# Patient Record
Sex: Female | Born: 1985 | Hispanic: No | Marital: Married | Smoking: Never smoker
Health system: Southern US, Community
[De-identification: ages and names within clinical notes are randomized; demographics above are authoritative.]

## PROBLEM LIST (undated history)

## (undated) DIAGNOSIS — R87629 Unspecified abnormal cytological findings in specimens from vagina: Secondary | ICD-10-CM

## (undated) DIAGNOSIS — I10 Essential (primary) hypertension: Secondary | ICD-10-CM

## (undated) DIAGNOSIS — E039 Hypothyroidism, unspecified: Secondary | ICD-10-CM

## (undated) HISTORY — PX: TONSILLECTOMY: SUR1361

## (undated) HISTORY — PX: LUMBAR DISC SURGERY: SHX700

## (undated) HISTORY — PX: WISDOM TOOTH EXTRACTION: SHX21

## (undated) HISTORY — DX: Unspecified abnormal cytological findings in specimens from vagina: R87.629

---

## 2018-05-09 LAB — OB RESULTS CONSOLE RUBELLA ANTIBODY, IGM: Rubella: IMMUNE

## 2018-05-09 LAB — OB RESULTS CONSOLE ANTIBODY SCREEN: Antibody Screen: NEGATIVE

## 2018-05-09 LAB — OB RESULTS CONSOLE RPR: RPR: NONREACTIVE

## 2018-05-09 LAB — OB RESULTS CONSOLE HEPATITIS B SURFACE ANTIGEN: Hepatitis B Surface Ag: NEGATIVE

## 2018-05-09 LAB — OB RESULTS CONSOLE ABO/RH: RH Type: POSITIVE

## 2018-05-09 LAB — OB RESULTS CONSOLE HIV ANTIBODY (ROUTINE TESTING)
HIV: NONREACTIVE
HIV: NONREACTIVE

## 2018-06-05 ENCOUNTER — Other Ambulatory Visit: Payer: Self-pay | Admitting: Obstetrics and Gynecology

## 2018-06-05 ENCOUNTER — Other Ambulatory Visit (HOSPITAL_COMMUNITY)
Admission: RE | Admit: 2018-06-05 | Discharge: 2018-06-05 | Disposition: A | Discharge status: Home / Self Care | Payer: 59 | Source: Ambulatory Visit | Attending: Obstetrics and Gynecology | Admitting: Obstetrics and Gynecology

## 2018-06-05 DIAGNOSIS — Z01411 Encounter for gynecological examination (general) (routine) with abnormal findings: Secondary | ICD-10-CM | POA: Diagnosis present

## 2018-06-05 LAB — OB RESULTS CONSOLE GC/CHLAMYDIA
Chlamydia: NEGATIVE
Gonorrhea: NEGATIVE

## 2018-06-07 LAB — CYTOLOGY - PAP
DIAGNOSIS: NEGATIVE
HPV (WINDOPATH): NOT DETECTED

## 2018-07-02 ENCOUNTER — Encounter (HOSPITAL_COMMUNITY): Payer: Self-pay

## 2018-08-01 NOTE — L&D Delivery Note (Signed)
Delivery Note At 9:19 AM a viable female was delivered via Vaginal, Spontaneous (Presentation:occiput anterior  ;  ). Nuchal cord x 1 reduced  APGAR: 9, 9; weight 4 lb 13.6 oz (2200 g).   Placenta status: ,intact  . 3 vessel  Cord:  with the following complications:None .  Cord pH: NA  Anesthesia:  None Episiotomy: None Lacerations: 2nd degree;Sulcus Suture Repair: 3.0 vicryl Est. Blood Loss (mL): 400  Mom to postpartum.  Baby to Couplet care / Skin to Skin.  Gerald Leitz 11/14/2018, 11:37 AM

## 2018-08-16 LAB — OB RESULTS CONSOLE RPR: RPR: NONREACTIVE

## 2018-08-31 ENCOUNTER — Other Ambulatory Visit (HOSPITAL_COMMUNITY): Payer: Self-pay | Admitting: Obstetrics and Gynecology

## 2018-08-31 DIAGNOSIS — Z363 Encounter for antenatal screening for malformations: Secondary | ICD-10-CM

## 2018-08-31 DIAGNOSIS — Z3A31 31 weeks gestation of pregnancy: Secondary | ICD-10-CM

## 2018-08-31 DIAGNOSIS — O365930 Maternal care for other known or suspected poor fetal growth, third trimester, not applicable or unspecified: Secondary | ICD-10-CM

## 2018-09-04 ENCOUNTER — Encounter (HOSPITAL_COMMUNITY): Payer: Self-pay | Admitting: *Deleted

## 2018-09-06 ENCOUNTER — Encounter (HOSPITAL_COMMUNITY): Payer: 59

## 2018-09-06 ENCOUNTER — Ambulatory Visit (HOSPITAL_BASED_OUTPATIENT_CLINIC_OR_DEPARTMENT_OTHER)
Admission: RE | Admit: 2018-09-06 | Discharge: 2018-09-06 | Disposition: A | Discharge status: Home / Self Care | Payer: 59 | Source: Ambulatory Visit | Attending: Obstetrics and Gynecology | Admitting: Obstetrics and Gynecology

## 2018-09-06 ENCOUNTER — Other Ambulatory Visit (HOSPITAL_COMMUNITY): Payer: Self-pay | Admitting: Obstetrics and Gynecology

## 2018-09-06 ENCOUNTER — Other Ambulatory Visit (HOSPITAL_COMMUNITY): Payer: Self-pay | Admitting: *Deleted

## 2018-09-06 ENCOUNTER — Ambulatory Visit (HOSPITAL_COMMUNITY)
Admission: RE | Admit: 2018-09-06 | Discharge: 2018-09-06 | Disposition: A | Discharge status: Home / Self Care | Payer: 59 | Source: Ambulatory Visit | Attending: Obstetrics and Gynecology | Admitting: Obstetrics and Gynecology

## 2018-09-06 ENCOUNTER — Encounter (HOSPITAL_COMMUNITY): Payer: Self-pay

## 2018-09-06 DIAGNOSIS — O365930 Maternal care for other known or suspected poor fetal growth, third trimester, not applicable or unspecified: Secondary | ICD-10-CM | POA: Insufficient documentation

## 2018-09-06 DIAGNOSIS — O10013 Pre-existing essential hypertension complicating pregnancy, third trimester: Secondary | ICD-10-CM | POA: Diagnosis not present

## 2018-09-06 DIAGNOSIS — Z362 Encounter for other antenatal screening follow-up: Secondary | ICD-10-CM

## 2018-09-06 DIAGNOSIS — Z363 Encounter for antenatal screening for malformations: Secondary | ICD-10-CM | POA: Diagnosis not present

## 2018-09-06 DIAGNOSIS — O99283 Endocrine, nutritional and metabolic diseases complicating pregnancy, third trimester: Secondary | ICD-10-CM | POA: Diagnosis not present

## 2018-09-06 DIAGNOSIS — Z3A31 31 weeks gestation of pregnancy: Secondary | ICD-10-CM | POA: Diagnosis present

## 2018-09-06 DIAGNOSIS — Z3A36 36 weeks gestation of pregnancy: Secondary | ICD-10-CM

## 2018-09-06 DIAGNOSIS — O10913 Unspecified pre-existing hypertension complicating pregnancy, third trimester: Secondary | ICD-10-CM | POA: Diagnosis not present

## 2018-09-06 DIAGNOSIS — E039 Hypothyroidism, unspecified: Secondary | ICD-10-CM | POA: Diagnosis not present

## 2018-09-06 DIAGNOSIS — O365990 Maternal care for other known or suspected poor fetal growth, unspecified trimester, not applicable or unspecified: Secondary | ICD-10-CM | POA: Diagnosis not present

## 2018-09-06 DIAGNOSIS — Z3A28 28 weeks gestation of pregnancy: Secondary | ICD-10-CM

## 2018-09-06 HISTORY — DX: Essential (primary) hypertension: I10

## 2018-09-06 HISTORY — DX: Hypothyroidism, unspecified: E03.9

## 2018-09-06 NOTE — Consult Note (Signed)
Maternal-Fetal Medicine Name: Brooke Reeves MRN: [id number] Requesting Provider: Geryl Rankins, MD  I had the pleasure of seeing Brooke Reeves today at  [facility]. She was accompanied by her husband. Patient is a G1 P0 at 28w 4d gestation with suspected fetal growth restriction.  On your office ultrasound performed in December 2019, the estimated fetal weight was at the 39th percentile. Subsequent ultrasound performed on 08/30/2018 showed the estimated fetal weight at the 19th percentile.   Her prenatal course has been uneventful so far. On serum screening (quad), the risks of fetal aneuploidies and open-neural tube defects are not increased.  Past medical history is significant for chronic hypertension. Patient was taking amlodipine before pregnancy and she discontinued at the start of pregnancy on the advice of her PCP. Her blood pressures have been during prenatal visits. She takes low-dose aspirin prophylaxis.  She does not have diabetes. She has a diagnosis of hypothyroidism and takes levothyroxine 25 micrograms daily.  PSH: Lumbosacral (L4-S1) discectomy (2017 in [place]), tonsillectomy (childhood).  Medications: Prenatal vitamins, levothyroxine, low-dose aspirin. Allergies: NKDA. Social: Denies tobacco or drug or alcohol use. She has been married 4+ years and her husband is in good health. Patient recently moved to the [place] (2017). Family: No history of venous thromboembolism in the family.  On ultrasound, amniotic fluid is normal and good fetal activity is seen. The estimated fetal weight is at the 17th percentile. Head circumference measurement is at between -1 SD and mean. Abdominal circumference measurement is at the 4th percentile. Incidentally observed antenatal testing is reassuring (BPP 8/8). Umbilical artery Doppler showed normal diastolic flow.  I counseled the patient on the following: Suspected fetal growth restriction: I explained the findings and  reassured the couple. I discussed the definition of IUGR that is based on the estimated fetal weigh being at less than the 10th percentile. It is difficult to differentiate between a constitutionally-small fetus and the one with growth restriction. In addition, there are ethnic variations.   Patient has one risk factor of chronic hypertension that is well-controlled without antihypertensives. This is her first pregnancy and both primigravida and chronic hypertension increase the likelihood of preeclampsia. Fetal growth restriction may precede development of preeclampsia. I discussed the benefit of low-dose aspirin that delays or prevents preeclampsia. She does not have any other risk factors.  I recommend a follow-up fetal growth assessment in 3 weeks. If EFW falls below the 10th percentile, we would recommend weekly antenatal testing till delivery. I explained antenatal testing (BPP, NST and Doppler) and their significance and limitations. Timing of delivery: ACOG recommends delivery between 38w 0d till 39w 6d gestation in isolated fetal growth restriction. If fetal growth restriction is diagnosed on subsequent visits, we would recommend delivery at 39 weeks provided antenatal testing is reassuring.  Her history of discectomy is not a contraindication to vaginal delivery.   Recommendations: -An appointment was made for her to return in 3 weeks for fetal growth assessment. -Continue low-dose aspirin. -BP checks at least weekly. -Continue levothyroxine. -TSH levels if not checked in the last month.  Thank you for your consult. Please do not hesitate to contact me if you have any questions or concerns.  Consultation including face-to-face counseling: 40 min.

## 2018-09-12 LAB — OB RESULTS CONSOLE HIV ANTIBODY (ROUTINE TESTING): HIV: NONREACTIVE

## 2018-09-27 ENCOUNTER — Ambulatory Visit (HOSPITAL_COMMUNITY): Payer: 59 | Admitting: *Deleted

## 2018-09-27 ENCOUNTER — Encounter (HOSPITAL_COMMUNITY): Payer: Self-pay | Admitting: *Deleted

## 2018-09-27 ENCOUNTER — Ambulatory Visit (HOSPITAL_COMMUNITY)
Admission: RE | Admit: 2018-09-27 | Discharge: 2018-09-27 | Disposition: A | Discharge status: Home / Self Care | Payer: 59 | Source: Ambulatory Visit | Attending: Obstetrics and Gynecology | Admitting: Obstetrics and Gynecology

## 2018-09-27 VITALS — BP 126/86 | HR 109 | Wt 150.1 lb

## 2018-09-27 DIAGNOSIS — O99280 Endocrine, nutritional and metabolic diseases complicating pregnancy, unspecified trimester: Principal | ICD-10-CM

## 2018-09-27 DIAGNOSIS — E079 Disorder of thyroid, unspecified: Secondary | ICD-10-CM

## 2018-09-27 DIAGNOSIS — E039 Hypothyroidism, unspecified: Secondary | ICD-10-CM

## 2018-09-27 DIAGNOSIS — Z362 Encounter for other antenatal screening follow-up: Secondary | ICD-10-CM | POA: Insufficient documentation

## 2018-09-27 DIAGNOSIS — O99283 Endocrine, nutritional and metabolic diseases complicating pregnancy, third trimester: Secondary | ICD-10-CM

## 2018-09-27 DIAGNOSIS — O10013 Pre-existing essential hypertension complicating pregnancy, third trimester: Secondary | ICD-10-CM | POA: Diagnosis not present

## 2018-09-27 DIAGNOSIS — Z3A31 31 weeks gestation of pregnancy: Secondary | ICD-10-CM

## 2018-09-27 DIAGNOSIS — O368930 Maternal care for other specified fetal problems, third trimester, not applicable or unspecified: Secondary | ICD-10-CM | POA: Diagnosis not present

## 2018-09-28 ENCOUNTER — Other Ambulatory Visit (HOSPITAL_COMMUNITY): Payer: Self-pay | Admitting: *Deleted

## 2018-09-28 DIAGNOSIS — Z362 Encounter for other antenatal screening follow-up: Secondary | ICD-10-CM

## 2018-10-18 ENCOUNTER — Ambulatory Visit (HOSPITAL_COMMUNITY): Payer: 59

## 2018-10-18 ENCOUNTER — Encounter (HOSPITAL_COMMUNITY): Payer: Self-pay

## 2018-10-31 LAB — OB RESULTS CONSOLE GBS: GBS: POSITIVE

## 2018-11-13 ENCOUNTER — Other Ambulatory Visit: Payer: Self-pay

## 2018-11-13 ENCOUNTER — Encounter (HOSPITAL_COMMUNITY): Payer: Self-pay

## 2018-11-13 ENCOUNTER — Other Ambulatory Visit: Payer: Self-pay | Admitting: Obstetrics and Gynecology

## 2018-11-13 ENCOUNTER — Telehealth (HOSPITAL_COMMUNITY): Payer: Self-pay | Admitting: *Deleted

## 2018-11-13 ENCOUNTER — Encounter (HOSPITAL_COMMUNITY): Payer: Self-pay | Admitting: *Deleted

## 2018-11-13 ENCOUNTER — Inpatient Hospital Stay (HOSPITAL_COMMUNITY)
Admission: AD | Admit: 2018-11-13 | Discharge: 2018-11-17 | DRG: 807 | Disposition: A | Discharge status: Home / Self Care | Payer: 59 | Attending: Obstetrics and Gynecology | Admitting: Obstetrics and Gynecology

## 2018-11-13 DIAGNOSIS — O1002 Pre-existing essential hypertension complicating childbirth: Principal | ICD-10-CM | POA: Diagnosis present

## 2018-11-13 DIAGNOSIS — E039 Hypothyroidism, unspecified: Secondary | ICD-10-CM | POA: Diagnosis present

## 2018-11-13 DIAGNOSIS — O99284 Endocrine, nutritional and metabolic diseases complicating childbirth: Secondary | ICD-10-CM | POA: Diagnosis present

## 2018-11-13 DIAGNOSIS — O365930 Maternal care for other known or suspected poor fetal growth, third trimester, not applicable or unspecified: Secondary | ICD-10-CM | POA: Diagnosis present

## 2018-11-13 DIAGNOSIS — O99824 Streptococcus B carrier state complicating childbirth: Secondary | ICD-10-CM | POA: Diagnosis present

## 2018-11-13 DIAGNOSIS — Z3A38 38 weeks gestation of pregnancy: Secondary | ICD-10-CM | POA: Diagnosis not present

## 2018-11-13 DIAGNOSIS — O10919 Unspecified pre-existing hypertension complicating pregnancy, unspecified trimester: Secondary | ICD-10-CM | POA: Diagnosis present

## 2018-11-13 LAB — COMPREHENSIVE METABOLIC PANEL
ALT: 14 U/L (ref 0–44)
AST: 32 U/L (ref 15–41)
Albumin: 2.6 g/dL — ABNORMAL LOW (ref 3.5–5.0)
Alkaline Phosphatase: 198 U/L — ABNORMAL HIGH (ref 38–126)
Anion gap: 13 (ref 5–15)
BUN: 8 mg/dL (ref 6–20)
CO2: 18 mmol/L — ABNORMAL LOW (ref 22–32)
Calcium: 9.4 mg/dL (ref 8.9–10.3)
Chloride: 105 mmol/L (ref 98–111)
Creatinine, Ser: 0.62 mg/dL (ref 0.44–1.00)
GFR calc Af Amer: >60 mL/min (ref >60)
GFR calc non Af Amer: >60 mL/min (ref >60)
Glucose, Bld: 125 mg/dL — ABNORMAL HIGH (ref 70–99)
Potassium: 4.2 mmol/L (ref 3.5–5.1)
Sodium: 136 mmol/L (ref 135–145)
Total Bilirubin: 0.4 mg/dL (ref 0.3–1.2)
Total Protein: 5.6 g/dL — ABNORMAL LOW (ref 6.5–8.1)

## 2018-11-13 LAB — CBC
HCT: 32.2 % — ABNORMAL LOW (ref 36.0–46.0)
Hemoglobin: 10.8 g/dL — ABNORMAL LOW (ref 12.0–15.0)
MCH: 32.4 pg (ref 26.0–34.0)
MCHC: 33.5 g/dL (ref 30.0–36.0)
MCV: 96.7 fL (ref 80.0–100.0)
Platelets: 100 10*3/uL — ABNORMAL LOW (ref 150–400)
RBC: 3.33 MIL/uL — ABNORMAL LOW (ref 3.87–5.11)
RDW: 15.8 % — ABNORMAL HIGH (ref 11.5–15.5)
WBC: 13.4 10*3/uL — ABNORMAL HIGH (ref 4.0–10.5)
nRBC: 0.1 % (ref 0.0–0.2)

## 2018-11-13 LAB — TYPE AND SCREEN
ABO/RH(D): A POS
Antibody Screen: NEGATIVE

## 2018-11-13 LAB — PROTEIN / CREATININE RATIO, URINE
Creatinine, Urine: 38.74 mg/dL
Total Protein, Urine: <6 mg/dL

## 2018-11-13 MED ORDER — ACETAMINOPHEN 325 MG PO TABS
650.0000 mg | ORAL_TABLET | ORAL | Status: DC | PRN
  Administered 2018-11-14: 01:00:00 650 mg via ORAL
  Filled 2018-11-13: qty 2

## 2018-11-13 MED ORDER — ONDANSETRON HCL 4 MG/2ML IJ SOLN: 4.0000 mg | Freq: Four times a day (QID) | INTRAMUSCULAR | Status: DC | PRN

## 2018-11-13 MED ORDER — HYDRALAZINE HCL 20 MG/ML IJ SOLN
10.0000 mg | INTRAMUSCULAR | Status: DC | PRN
  Administered 2018-11-13: 22:00:00 10 mg via INTRAVENOUS

## 2018-11-13 MED ORDER — TERBUTALINE SULFATE 1 MG/ML IJ SOLN: 0.2500 mg | Freq: Once | INTRAMUSCULAR | Status: DC | PRN

## 2018-11-13 MED ORDER — SOD CITRATE-CITRIC ACID 500-334 MG/5ML PO SOLN: 30.0000 mL | ORAL | Status: DC | PRN

## 2018-11-13 MED ORDER — OXYTOCIN 40 UNITS IN NORMAL SALINE INFUSION - SIMPLE MED: 2.5000 [IU]/h | INTRAVENOUS | Status: DC

## 2018-11-13 MED ORDER — OXYTOCIN 40 UNITS IN NORMAL SALINE INFUSION - SIMPLE MED
1.0000 m[IU]/min | INTRAVENOUS | Status: DC
  Administered 2018-11-13: 2 m[IU]/min via INTRAVENOUS
  Filled 2018-11-13: qty 1000

## 2018-11-13 MED ORDER — PENICILLIN G 3 MILLION UNITS IVPB - SIMPLE MED
3.0000 10*6.[IU] | INTRAVENOUS | Status: DC
  Administered 2018-11-14 (×2): 3 10*6.[IU] via INTRAVENOUS
  Filled 2018-11-13 (×2): qty 100

## 2018-11-13 MED ORDER — LACTATED RINGERS IV SOLN
INTRAVENOUS | Status: DC
  Administered 2018-11-13 – 2018-11-14 (×3): via INTRAVENOUS

## 2018-11-13 MED ORDER — SODIUM CHLORIDE 0.9 % IV SOLN
5.0000 10*6.[IU] | Freq: Once | INTRAVENOUS | Status: AC
  Administered 2018-11-13: 5 10*6.[IU] via INTRAVENOUS
  Filled 2018-11-13: qty 5

## 2018-11-13 MED ORDER — OXYTOCIN BOLUS FROM INFUSION: 500.0000 mL | Freq: Once | INTRAVENOUS | Status: DC

## 2018-11-13 MED ORDER — OXYCODONE-ACETAMINOPHEN 5-325 MG PO TABS: 1.0000 | ORAL_TABLET | ORAL | Status: DC | PRN

## 2018-11-13 MED ORDER — LACTATED RINGERS IV SOLN: 500.0000 mL | INTRAVENOUS | Status: DC | PRN

## 2018-11-13 MED ORDER — BUTORPHANOL TARTRATE 1 MG/ML IJ SOLN
1.0000 mg | INTRAMUSCULAR | Status: DC | PRN
  Administered 2018-11-13 – 2018-11-14 (×3): 1 mg via INTRAVENOUS
  Filled 2018-11-13 (×3): qty 1

## 2018-11-13 MED ORDER — MISOPROSTOL 25 MCG QUARTER TABLET: 25.0000 ug | ORAL_TABLET | ORAL | Status: DC | PRN

## 2018-11-13 MED ORDER — MISOPROSTOL 50MCG HALF TABLET
ORAL_TABLET | ORAL | Status: AC
  Filled 2018-11-13: qty 1

## 2018-11-13 MED ORDER — LABETALOL HCL 5 MG/ML IV SOLN: 40.0000 mg | INTRAVENOUS | Status: DC | PRN

## 2018-11-13 MED ORDER — MISOPROSTOL 25 MCG QUARTER TABLET
ORAL_TABLET | ORAL | Status: AC
  Filled 2018-11-13: qty 1

## 2018-11-13 MED ORDER — MISOPROSTOL 50MCG HALF TABLET
50.0000 ug | ORAL_TABLET | ORAL | Status: DC | PRN
  Administered 2018-11-13: 16:00:00 50 ug via ORAL

## 2018-11-13 MED ORDER — OXYCODONE-ACETAMINOPHEN 5-325 MG PO TABS: 2.0000 | ORAL_TABLET | ORAL | Status: DC | PRN

## 2018-11-13 MED ORDER — LABETALOL HCL 5 MG/ML IV SOLN
20.0000 mg | INTRAVENOUS | Status: DC | PRN
  Administered 2018-11-13: 20 mg via INTRAVENOUS
  Filled 2018-11-13: qty 4

## 2018-11-13 MED ORDER — LIDOCAINE HCL (PF) 1 % IJ SOLN
30.0000 mL | INTRAMUSCULAR | Status: AC | PRN
  Administered 2018-11-14: 30 mL via SUBCUTANEOUS
  Filled 2018-11-13: qty 30

## 2018-11-13 MED ORDER — HYDROXYZINE HCL 50 MG PO TABS: 50.0000 mg | ORAL_TABLET | Freq: Four times a day (QID) | ORAL | Status: DC | PRN

## 2018-11-13 MED ORDER — ZOLPIDEM TARTRATE 5 MG PO TABS: 5.0000 mg | ORAL_TABLET | Freq: Every evening | ORAL | Status: DC | PRN

## 2018-11-13 MED ORDER — HYDRALAZINE HCL 20 MG/ML IJ SOLN
5.0000 mg | INTRAMUSCULAR | Status: DC | PRN
  Administered 2018-11-13: 5 mg via INTRAVENOUS
  Filled 2018-11-13: qty 1

## 2018-11-13 NOTE — H&P (Signed)
Brooke Reeves is a 33 y.o. female G1 @ 38 2/7 weeks with CHTN, symmetric IUGR presenting IOL. CHTN prior to pregnancy, which was managed with Amlodipine.  Pt has not required medication this pregnancy.  BP on average 110s-120s/70s.  Pt's BP today in the office is 140/90, 144/82, 138/82.  Pt denies headache, visual changes, upper abdominal pain.  Pt previously had  SGA baby previously with normal AC with MFM x 2. Subsequently,  IUGR diagnosed at 36 weeks.  FOB refused dopplers b/c he felt that in his ethnicity/genetic baby's are smaller and baby was active and increased cost.  Refused IOL at 37 weeks..  D/w Dr. Judeth Reeves.  Ok to do weekly NSTs/BPP until 38-39 weeks.  Pt reports baby has been reassuring.  Testing today shows BPP 10/10.   OB History    Gravida  1   Para      Term      Preterm      AB      Living  0     SAB      TAB      Ectopic      Multiple      Live Births             Past Medical History:  Diagnosis Date  . Hypertension   . Hypothyroidism   . Vaginal Pap smear, abnormal    Past Surgical History:  Procedure Laterality Date  . LUMBAR DISC SURGERY    . TONSILLECTOMY    . WISDOM TOOTH EXTRACTION     Family History: family history includes Heart disease in her maternal grandfather; Hypertension in her brother, maternal grandfather, mother, and paternal grandfather. Social History:  reports that she has never smoked. She has never used smokeless tobacco. She reports that she does not drink alcohol or use drugs.     Maternal Diabetes: No Genetic Screening: Normal Maternal Ultrasounds/Referrals: Abnormal:  Findings:   IUGR Fetal Ultrasounds or other Referrals:  Referred to Materal Fetal Medicine  Maternal Substance Abuse:  No Significant Maternal Medications:  Meds include: Syntroid Significant Maternal Lab Results:  Lab values include: Group B Strep positive Other Comments:  Maternal h/o CHTN  ROS Maternal Medical History:  Contractions: Frequency:  rare.   Perceived severity is mild.    Fetal activity: Perceived fetal activity is normal.    Prenatal complications: PIH.   Prenatal Complications - Diabetes: none.      Last menstrual period 02/18/2018. Maternal Exam:  Uterine Assessment: Contraction frequency is rare.   Abdomen: Patient reports no abdominal tenderness. Estimated fetal weight is 5 lb 11 oz +/- 8 oz EFW 11%- 11/13/18, [place] 5.5 lbs.   Fetal presentation: vertex  Introitus: Normal vulva. Pelvis: adequate for delivery.   Cervix: Cervix evaluated by digital exam.     Fetal Exam Fetal Monitor Review: Mode: fetoscope.   Baseline rate: 130s.  Variability: moderate (6-25 bpm).   Pattern: accelerations present.   BPP 10/10, AFI = 17.1 cm  Fetal State Assessment: Category I - tracings are normal.     Physical Exam  Constitutional: She is oriented to person, place, and time. She appears well-developed and well-nourished.  HENT:  Head: Normocephalic and atraumatic.  Eyes: EOM are normal.  Neck: Normal range of motion.  Respiratory: Effort normal. No respiratory distress.  GI: There is no abdominal tenderness.  Genitourinary:    Vulva normal.   Musculoskeletal: Normal range of motion.        General: Edema present. No tenderness.  Neurological: She is alert and oriented to person, place, and time. She has normal reflexes.  Skin: Skin is warm and dry.  Psychiatric: She has a normal mood and affect.    Prenatal labs: ABO, Rh: A/Positive/-- (10/09 0000) Antibody: Negative (10/09 0000) Rubella: Immune (10/09 0000) RPR: Nonreactive (10/09 0000)  HBsAg: Negative (10/09 0000)  HIV: Non-reactive (10/09 0000)  GBS:   Positive  Assessment/Plan: IUP @ 38 2/7 weeks CHTN   Normal BP until today.  Recommend moving towards delivery  Cytotec for cervical ripening.  Discussed with pt and husband at length.  Baseline preeclampsia labs on admission. IUGR, symmetric.    BPP 10/10 H/o L4-L5  discectomy.  Previously cleared with anesthesiologist. Hypothyroidism- Well controlled on 25 mcg GBS positive.  PCN with ROM or labor.  Dr. Charlotta Newtonzan covering and has been given a full report.  Pt and husband are aware. Brooke Reeves 11/13/2018, 12:39 PM

## 2018-11-13 NOTE — Telephone Encounter (Signed)
Preadmission screen  

## 2018-11-13 NOTE — Progress Notes (Signed)
OB PN:  S: Pt was feeling painful contractions, now resting comfortably s/p  O: BP (!) 157/104 (BP Location: Right Arm)   Pulse 92   Temp 98.4 F (36.9 C) (Oral)   Resp 16   Ht 5\' 2"  (1.575 m)   Wt 73.1 kg   LMP 02/18/2018   BMI 29.47 kg/m    BP range: 147-157/95-115  FHT: 140bpm, moderate variablity, + accels, no decels Toco: irregular SVE: 1/30-3, Cook balloon placed with 50cc  Results for orders placed or performed during the hospital encounter of 11/13/18 (from the past 24 hour(s))  Type and screen     Status: None   Collection Time: 11/13/18  3:17 PM  Result Value Ref Range   ABO/RH(D) A POS    Antibody Screen NEG    Sample Expiration      11/16/2018 Performed at [facility], [address] [place], [place] [address]   ABO/Rh     Status: None   Collection Time: 11/13/18  3:17 PM  Result Value Ref Range   ABO/RH(D)      A POS Performed at [facility], [address] [place], [place] [address]   CBC     Status: Abnormal   Collection Time: 11/13/18  3:40 PM  Result Value Ref Range   WBC 13.4 (H) 4.0 - 10.5 K/uL   RBC 3.33 (L) 3.87 - 5.11 MIL/uL   Hemoglobin 10.8 (L) 12.0 - 15.0 g/dL   HCT 59.2 (L) 92.4 - 46.2 %   MCV 96.7 80.0 - 100.0 fL   MCH 32.4 26.0 - 34.0 pg   MCHC 33.5 30.0 - 36.0 g/dL   RDW 86.3 (H) 81.7 - 71.1 %   Platelets 100 (L) 150 - 400 K/uL   nRBC 0.1 0.0 - 0.2 %  Protein / creatinine ratio, urine     Status: None   Collection Time: 11/13/18  3:40 PM  Result Value Ref Range   Creatinine, Urine 38.74 mg/dL   Total Protein, Urine <6 mg/dL   Protein Creatinine Ratio        0.00 - 0.15 mg/mg[Cre]    A/P: 33 y.o. G1P0 @ [redacted]w[redacted]d for IOL due to chronic HTN 1. FWB: Cat. I 2. Labor: plan to start Pit per protocol Cook balloon in place Pain: IV as needed or epidural upon request GBS: positive, plan to start PCN per protocol  3. Chronic HTN -s/p IV Hydralazine x 1 -labs normal as above -will continue to closely monitor  Myna Hidalgo, DO [phone] (cell) [phone] (office)

## 2018-11-14 ENCOUNTER — Inpatient Hospital Stay (HOSPITAL_COMMUNITY): Payer: 59 | Admitting: Anesthesiology

## 2018-11-14 ENCOUNTER — Encounter (HOSPITAL_COMMUNITY): Payer: Self-pay | Admitting: *Deleted

## 2018-11-14 LAB — CBC
HCT: 33.7 % — ABNORMAL LOW (ref 36.0–46.0)
Hemoglobin: 11.3 g/dL — ABNORMAL LOW (ref 12.0–15.0)
MCH: 32.1 pg (ref 26.0–34.0)
MCHC: 33.5 g/dL (ref 30.0–36.0)
MCV: 95.7 fL (ref 80.0–100.0)
Platelets: 112 10*3/uL — ABNORMAL LOW (ref 150–400)
RBC: 3.52 MIL/uL — ABNORMAL LOW (ref 3.87–5.11)
RDW: 15.9 % — ABNORMAL HIGH (ref 11.5–15.5)
WBC: 26.6 10*3/uL — ABNORMAL HIGH (ref 4.0–10.5)
nRBC: 0 % (ref 0.0–0.2)

## 2018-11-14 LAB — ABO/RH: ABO/RH(D): A POS

## 2018-11-14 LAB — RPR: RPR Ser Ql: NONREACTIVE

## 2018-11-14 MED ORDER — ONDANSETRON HCL 4 MG PO TABS: 4.0000 mg | ORAL_TABLET | ORAL | Status: DC | PRN

## 2018-11-14 MED ORDER — ACETAMINOPHEN 325 MG PO TABS
650.0000 mg | ORAL_TABLET | ORAL | Status: DC | PRN
  Administered 2018-11-16: 650 mg via ORAL
  Filled 2018-11-14: qty 2

## 2018-11-14 MED ORDER — METHYLERGONOVINE MALEATE 0.2 MG/ML IJ SOLN: 0.2000 mg | INTRAMUSCULAR | Status: DC | PRN

## 2018-11-14 MED ORDER — PRENATAL MULTIVITAMIN CH
1.0000 | ORAL_TABLET | Freq: Every day | ORAL | Status: DC
  Administered 2018-11-15 – 2018-11-17 (×3): 1 via ORAL
  Filled 2018-11-14 (×3): qty 1

## 2018-11-14 MED ORDER — DIPHENHYDRAMINE HCL 50 MG/ML IJ SOLN: 12.5000 mg | INTRAMUSCULAR | Status: DC | PRN

## 2018-11-14 MED ORDER — PHENYLEPHRINE 40 MCG/ML (10ML) SYRINGE FOR IV PUSH (FOR BLOOD PRESSURE SUPPORT): 80.0000 ug | PREFILLED_SYRINGE | INTRAVENOUS | Status: DC | PRN

## 2018-11-14 MED ORDER — WITCH HAZEL-GLYCERIN EX PADS: 1.0000 "application " | MEDICATED_PAD | CUTANEOUS | Status: DC | PRN

## 2018-11-14 MED ORDER — OXYCODONE HCL 5 MG PO TABS: 10.0000 mg | ORAL_TABLET | ORAL | Status: DC | PRN

## 2018-11-14 MED ORDER — BENZOCAINE-MENTHOL 20-0.5 % EX AERO
1.0000 "application " | INHALATION_SPRAY | CUTANEOUS | Status: DC | PRN
  Administered 2018-11-14: 1 via TOPICAL
  Filled 2018-11-14 (×2): qty 56

## 2018-11-14 MED ORDER — LACTATED RINGERS IV SOLN
500.0000 mL | Freq: Once | INTRAVENOUS | Status: AC
  Administered 2018-11-14: 500 mL via INTRAVENOUS

## 2018-11-14 MED ORDER — LEVOTHYROXINE SODIUM 50 MCG PO TABS
25.0000 ug | ORAL_TABLET | Freq: Every day | ORAL | Status: DC
  Administered 2018-11-15 – 2018-11-17 (×3): 25 ug via ORAL
  Filled 2018-11-14 (×3): qty 1

## 2018-11-14 MED ORDER — OXYCODONE HCL 5 MG PO TABS: 5.0000 mg | ORAL_TABLET | ORAL | Status: DC | PRN

## 2018-11-14 MED ORDER — EPHEDRINE 5 MG/ML INJ: 10.0000 mg | INTRAVENOUS | Status: DC | PRN

## 2018-11-14 MED ORDER — SIMETHICONE 80 MG PO CHEW: 80.0000 mg | CHEWABLE_TABLET | ORAL | Status: DC | PRN

## 2018-11-14 MED ORDER — ZOLPIDEM TARTRATE 5 MG PO TABS: 5.0000 mg | ORAL_TABLET | Freq: Every evening | ORAL | Status: DC | PRN

## 2018-11-14 MED ORDER — IBUPROFEN 600 MG PO TABS
600.0000 mg | ORAL_TABLET | Freq: Four times a day (QID) | ORAL | Status: DC
  Administered 2018-11-14 – 2018-11-17 (×12): 600 mg via ORAL
  Filled 2018-11-14 (×12): qty 1

## 2018-11-14 MED ORDER — DIPHENHYDRAMINE HCL 25 MG PO CAPS
25.0000 mg | ORAL_CAPSULE | Freq: Four times a day (QID) | ORAL | Status: DC | PRN
  Administered 2018-11-15: 25 mg via ORAL
  Filled 2018-11-14: qty 1

## 2018-11-14 MED ORDER — METHYLERGONOVINE MALEATE 0.2 MG PO TABS: 0.2000 mg | ORAL_TABLET | ORAL | Status: DC | PRN

## 2018-11-14 MED ORDER — DIBUCAINE (PERIANAL) 1 % EX OINT: 1.0000 "application " | TOPICAL_OINTMENT | CUTANEOUS | Status: DC | PRN

## 2018-11-14 MED ORDER — SENNOSIDES-DOCUSATE SODIUM 8.6-50 MG PO TABS
2.0000 | ORAL_TABLET | ORAL | Status: DC
  Administered 2018-11-15 – 2018-11-17 (×3): 2 via ORAL
  Filled 2018-11-14 (×3): qty 2

## 2018-11-14 MED ORDER — FENTANYL-BUPIVACAINE-NACL 0.5-0.125-0.9 MG/250ML-% EP SOLN: 12.0000 mL/h | EPIDURAL | Status: DC | PRN

## 2018-11-14 MED ORDER — ONDANSETRON HCL 4 MG/2ML IJ SOLN: 4.0000 mg | INTRAMUSCULAR | Status: DC | PRN

## 2018-11-14 MED ORDER — COCONUT OIL OIL: 1.0000 "application " | TOPICAL_OIL | Status: DC | PRN

## 2018-11-14 NOTE — Progress Notes (Signed)
Brooke Reeves is a 33 y.o. G1P0 at 104w3d  admitted for induction of labor due to Hypertension and Poor fetal growth.  Subjective: Patient desires an epidural. She does not want to start pushing although she is complete. SROM at 550  Objective: BP (!) 148/93   Pulse 97   Temp (!) 97.5 F (36.4 C) (Oral)   Resp 20   Ht 5\' 2"  (1.575 m)   Wt 73.1 kg   LMP 02/18/2018   BMI 29.47 kg/m  No intake/output data recorded. No intake/output data recorded.  FHT: Baseline 120 moderate variability. Variable decelerations.  UC:   regular, every 2 minutes on 6 mU of pitocin SVE:   Dilation: 10 Effacement (%): 90 Station: Plus 2 Exam by:: Dr. Richardson Dopp  Labs: Lab Results  Component Value Date   WBC 26.6 (H) 11/14/2018   HGB 11.3 (L) 11/14/2018   HCT 33.7 (L) 11/14/2018   MCV 95.7 11/14/2018   PLT 112 (L) 11/14/2018    Assessment / Plan: Induction of labor due to IUGR and gestational hypertension,  progressing well on pitocin  Labor: Progressing normally Preeclampsia:  labs stable Fetal Wellbeing:  Category I and Category II Pain Control:  pt does not desire to start pushing without an epidural  I/D:  Penicillin Anticipated MOD:  NSVD  Gerald Leitz 11/14/2018, 8:01 AM

## 2018-11-14 NOTE — Lactation Note (Signed)
This note was copied from a baby's chart. Lactation Consultation Note  Patient Name: Brooke Reeves Date: 11/14/2018 Reason for consult: Initial assessment;Primapara;1st time breastfeeding;Early term 37-38.6wks;Maternal endocrine disorder;Infant < 6lbs Type of Endocrine Disorder?: Thyroid  Initial visit of P1 mom, baby is now 5 hours old, <5lbs. LC entered room to find MBRN at bedside assessing infant, states mom is about to get up to bathroom for the first time. Infant noted to be cold and just had blood glucose drawn by lab. Infant's oral anatomy WNL and minimal attempts to suck on LC gloved finger. Per MBRN mom tried to latch baby and also bottlefed x1 with moderate difficulty each method. FOB at bedside and currently feeding mom. Mom appears exhausted and asks for LC to return later. Lactation brochure, virtual breastfeeding support group hand out, and feeding diary left with parents. MBRN states she will attempt to bottle feed baby with slow flow nipple at this time.  Consult Status Consult Status: Follow-up Date: 11/14/18 Follow-up type: In-patient    Virgia Land 11/14/2018, 2:36 PM

## 2018-11-14 NOTE — Lactation Note (Signed)
This note was copied from a baby's chart. Lactation Consultation Note  Patient Name: Brooke Reeves [id number] Date: 11/14/2018 Reason for consult: Initial assessment;Primapara;1st time breastfeeding;Early term 37-38.6wks;Maternal endocrine disorder;Infant < 6lbs Type of Endocrine Disorder?: Thyroid  LC entered room to follow up from previous visit and both parents sleeping soundly.  Consult Status Consult Status: Follow-up Date: 11/14/18 Follow-up type: In-patient    Brooke Reeves 11/14/2018, 5:11 PM

## 2018-11-15 LAB — CBC
HCT: 23.6 % — ABNORMAL LOW (ref 36.0–46.0)
Hemoglobin: 7.8 g/dL — ABNORMAL LOW (ref 12.0–15.0)
MCH: 32.5 pg (ref 26.0–34.0)
MCHC: 33.1 g/dL (ref 30.0–36.0)
MCV: 98.3 fL (ref 80.0–100.0)
Platelets: 101 10*3/uL — ABNORMAL LOW (ref 150–400)
RBC: 2.4 MIL/uL — ABNORMAL LOW (ref 3.87–5.11)
RDW: 16.2 % — ABNORMAL HIGH (ref 11.5–15.5)
WBC: 21 10*3/uL — ABNORMAL HIGH (ref 4.0–10.5)
nRBC: 0.1 % (ref 0.0–0.2)

## 2018-11-15 NOTE — Lactation Note (Signed)
This note was copied from a baby's chart. Lactation Consultation Note  Patient Name: Girl Weston Loaiza [id number] Date: 11/15/2018 Reason for consult: Follow-up assessment;Primapara;Early term 37-38.6wks;Infant < 6lbs Baby is 29 hours old/4% weight loss.  Mom is pumping for the second time.  It has been 10 hours since the last pumping.  Reviewed pump and stressed importance of pumping every 3 hours.  No colostrum obtained.  Baby is nuzzling skin to skin at breast but no latch achieved.  Baby is receiving neosure 22 calorie and taking 10 mls at a feeding.  Questions answered.  Instructed to call for concerns/assist.  Maternal Data    Feeding    LATCH Score                   Interventions    Lactation Tools Discussed/Used     Consult Status Consult Status: Follow-up Date: 11/16/18 Follow-up type: In-patient    Huston Foley 11/15/2018, 2:57 PM

## 2018-11-15 NOTE — Lactation Note (Signed)
This note was copied from a baby's chart. Lactation Consultation Note LC attempted to see mom earlier in shift, mom and baby sleeping.  Baby 18 hrs old. Asked mom if she needed an interpreter, mom stated no. FOB stated no. Encouraged if doesn't understand LC ask for interpreter.  Baby alert. Mom sitting on edge of bed feeding baby formula Similac 22 cal. Mom stated baby will not latch to breast. LPI information sheet given to follow d/t BW 4.13lbs Mom understands baby needs to be supplemented after BF. Reviewed amount of how much to feed baby.  Mom has everted nipples. Hand expressed for a couple of minutes. No colostrum noted. Discussed reasoning for mom to pump. Mom shown how to use DEBP & how to disassemble, clean, & reassemble parts. Mom knows to pump q3h for 15-20 min. Mom pumped w/no colostrum noted.  Hand expressed after pumping, no colostrum noted.  Newborn behavior, STS, I&O, breast massage, milk storage, supply and demand discussed. Stressed importance of I&O.  Encouraged mom to call for assistance w/feeding or questions. Brochure given to mom.  Patient Name: Brooke Reeves Date: 11/15/2018 Reason for consult: Initial assessment;Infant < 6lbs;1st time breastfeeding;Early term 37-38.6wks;Difficult latch;Maternal endocrine disorder Type of Endocrine Disorder?: Thyroid   Maternal Data Has patient been taught Hand Expression?: Yes Does the patient have breastfeeding experience prior to this delivery?: No  Feeding Feeding Type: Bottle Fed - Formula Nipple Type: Slow - flow  LATCH Score       Type of Nipple: Everted at rest and after stimulation  Comfort (Breast/Nipple): Soft / non-tender        Interventions Interventions: Breast feeding basics reviewed;Breast compression;DEBP;Breast massage  Lactation Tools Discussed/Used Tools: Pump Breast pump type: Double-Electric Breast Pump Pump Review: Setup, frequency, and cleaning;Milk  Storage Initiated by:: Peri Jefferson RN IBCLC Date initiated:: 11/15/18   Consult Status Consult Status: Follow-up Date: 11/15/18 Follow-up type: In-patient    Charyl Dancer 11/15/2018, 4:15 AM

## 2018-11-15 NOTE — Progress Notes (Signed)
Postpartum Note Day # 1  S:  Patient resting comfortable in bed.  Pain controlled.  Tolerating general diet. + flatus, + BM.  Lochia moderate.  Ambulating without difficulty.  She denies n/v/f/c, SOB, or CP.  Pt plans on breastfeeding and supplemental feeding with bottle  O: Temp:  [98 F (36.7 C)-98.6 F (37 C)] 98.4 F (36.9 C) (04/16 0558) Pulse Rate:  [84-108] 93 (04/16 0558) Resp:  [16-20] 16 (04/16 0558) BP: (106-158)/(63-101) 139/101 (04/16 0558) SpO2:  [97 %-99 %] 98 % (04/16 0558) Gen: A&Ox3, NAD CV: RRR Resp: CTAB Abdomen: soft, NT, ND Uterus: firm, non-tender, below umbilicus Ext: No edema, no calf tenderness bilaterally  Labs:  Recent Labs    11/13/18 1540 11/14/18 0633  HGB 10.8* 11.3*    A/P: Pt is a 33 y.o. G1P1001 s/p NSVD, PPD#1  -chronic HTN: high normal range, no medication currently, will continue to closely monitor - Pain well controlled -GU: voiding freely -GI: Tolerating general diet -Activity: encouraged sitting up to chair and ambulation as tolerated -Prophylaxis: early ambulation -Labs: stable as above -Hypothyroidism- continue synthroid daily  DISPO: Continue with routine postpartum care, plan for discharge home tomorrow  Myna Hidalgo, DO [phone] (cell) [phone] (office)

## 2018-11-15 NOTE — Lactation Note (Signed)
This note was copied from a baby's chart. No feeds charted between 1445-2200 on 11/14/18.  Parents state day shift RN attempted to feed infant twice.

## 2018-11-16 MED ORDER — NIFEDIPINE ER OSMOTIC RELEASE 30 MG PO TB24
30.0000 mg | ORAL_TABLET | Freq: Every day | ORAL | Status: DC
  Administered 2018-11-16 – 2018-11-17 (×2): 30 mg via ORAL
  Filled 2018-11-16 (×2): qty 1

## 2018-11-16 NOTE — Progress Notes (Signed)
[facility] CNM notified of elevated blood pressure 150/96.  Wants pt to be monitored tonight

## 2018-11-16 NOTE — Lactation Note (Signed)
This note was copied from a baby's chart. Lactation Consultation Note  Patient Name: Brooke Reeves [id number] Date: 11/16/2018  P1, 41 hour female infant, ETI infant , weight loss -4%. Infant had 5 stools and 4 voids since delivery.  Mom has not been latching infant to breast at every feeding Dad feels mom doesn't have breast milk present.  Infant is currently receiving 20 ml of 22 kcal Similac Neosure with iron. Infant was asleep in Dad's arms when LC entered the room. Per mom, she has only DEBP maybe twice. LC discussed hand expression and worked with mom learning how to hand express,  mom has colostrum present in both breast and Mom and Dad was surprise to see colostrum. Mom plans to start latching infant to breast first at each feeding and then supplement with formula afterwards base on infant's age and hours of life. LC reviewed how to use DEBP and mom plans to pump every 3 hours and give infant back EBM. Mom will call Nurse or LC if she has any further questions, concerns or need assistance latching infant to breast.  Maternal Data    Feeding Feeding Type: Bottle Fed - Formula  LATCH Score                   Interventions    Lactation Tools Discussed/Used     Consult Status      Danelle Earthly 11/16/2018, 2:19 AM

## 2018-11-16 NOTE — Progress Notes (Signed)
Post Partum Day 2 SVD Subjective: no complaints, up ad lib, voiding and tolerating PO. She denies headache visual disturbances or RUQ pain   Objective: Blood pressure (!) 147/99, pulse (!) 108, temperature 98.6 F (37 C), temperature source Oral, resp. rate 20, height 5\' 2"  (1.575 m), weight 73.1 kg, last menstrual period 02/18/2018, SpO2 99 %, unknown if currently breastfeeding.  Physical Exam:  General: alert, cooperative and no distress Lochia: appropriate Uterine Fundus: firm Incision: NA DVT Evaluation: No evidence of DVT seen on physical exam.  Recent Labs    11/14/18 0633 11/15/18 0645  HGB 11.3* 7.8*  HCT 33.7* 23.6*    Assessment/Plan: Pt doing well s/p SVD.  Pain control- continue motrin ... oxycodone ordered if needed given extensive vaginal and perineal laceration.  Chronic hypertension - this is not well controlled will add Procardix XL30 mg... d/c home tomorrow if bp is well controlled.  Routine postpartum care    LOS: 3 days   Gerald Leitz 11/16/2018, 6:47 AM

## 2018-11-17 MED ORDER — IBUPROFEN 600 MG PO TABS: 600.0000 mg | ORAL_TABLET | Freq: Four times a day (QID) | ORAL | 0 refills | Status: AC

## 2018-11-17 MED ORDER — NIFEDIPINE ER 30 MG PO TB24: 30.0000 mg | ORAL_TABLET | Freq: Every day | ORAL | 1 refills | Status: AC

## 2018-11-17 NOTE — Discharge Summary (Signed)
OB Discharge Summary     Patient Name: Brooke Reeves DOB: Dec 11, 1985 MRN: [id number]  Date of admission: 11/13/2018 Delivering MD: Gerald LeitzOLE, TARA   Date of discharge: 11/17/2018  Admitting diagnosis: pregnancy Intrauterine pregnancy: 1484w3d     Secondary diagnosis:  Active Problems:   HTN in pregnancy, chronic     Discharge diagnosis: Term Pregnancy Delivered and CHTN                                                                                                Post partum procedures:n/a  Augmentation: Pitocin, Cytotec and Foley Balloon  Complications: None  Hospital course:  Induction of Labor With Vaginal Delivery   33 y.o. yo G1P1001 at 8584w3d was admitted to the hospital 11/13/2018 for induction of labor.  Indication for induction: chronic hypertension.  Patient had an uncomplicated labor course as follows: Membrane Rupture Time/Date: 5:50 AM ,11/14/2018   Intrapartum Procedures: Episiotomy: None [1]                                         Lacerations:  2nd degree [3];Sulcus [9]  Patient had delivery of a Viable infant.  Information for the patient's newborn:  Zenaida DeedJAHAN, Girl Anushri [[id number]][[id number]]  Delivery Method: Vag-Spont   11/14/2018  Details of delivery can be found in separate delivery note.  Patient had a routine postpartum course. Patient is discharged home 11/17/18.  Physical exam  Vitals:   11/16/18 2208 11/17/18 0120 11/17/18 0600 11/17/18 1110  BP: (!) 150/96 (!) 134/94 (!) 143/95 (!) 142/96  Pulse: 96 98 99   Resp: 18 16 16 18   Temp: 98.4 F (36.9 C) 97.8 F (36.6 C) 97.6 F (36.4 C) 98.1 F (36.7 C)  TempSrc: Oral Oral Oral Oral  SpO2:      Weight:      Height:       General: alert, cooperative and no distress Lochia: appropriate Uterine Fundus: firm Incision: N/A DVT Evaluation: No evidence of DVT seen on physical exam. Negative Homan's sign. No cords or calf tenderness. No significant calf/ankle edema. Chronic hypertension: Patient denies symptoms of  preeclampsia. Reviewed preeclampsia precautions and handout given. Patient to follow up for blood pressure check in 1 week. Patient to call or present to [facility] for symptoms of preeclampsia.   Labs: Lab Results  Component Value Date   WBC 21.0 (H) 11/15/2018   HGB 7.8 (L) 11/15/2018   HCT 23.6 (L) 11/15/2018   MCV 98.3 11/15/2018   PLT 101 (L) 11/15/2018   CMP Latest Ref Rng & Units 11/13/2018  Glucose 70 - 99 mg/dL 782(N125(H)  BUN 6 - 20 mg/dL 8  Creatinine 5.620.44 - 1.301.00 mg/dL 8.650.62  Sodium 784135 - 696145 mmol/L 136  Potassium 3.5 - 5.1 mmol/L 4.2  Chloride 98 - 111 mmol/L 105  CO2 22 - 32 mmol/L 18(L)  Calcium 8.9 - 10.3 mg/dL 9.4  Total Protein 6.5 - 8.1 g/dL 2.9(B5.6(L)  Total Bilirubin 0.3 - 1.2 mg/dL 0.4  Alkaline Phos 38 -  126 U/L 198(H)  AST 15 - 41 U/L 32  ALT 0 - 44 U/L 14    Discharge instruction: per After Visit Summary and "Baby and Me Booklet".   After visit meds:  Allergies as of 11/17/2018   No Known Allergies     Medication List    STOP taking these medications   ASPIRIN 81 PO     TAKE these medications   acetaminophen 325 MG tablet Commonly known as:  TYLENOL Take 650 mg by mouth every 6 (six) hours as needed.   CALCIUM PO Take 600 mg by mouth daily.   ibuprofen 600 MG tablet Commonly known as:  ADVIL Take 1 tablet (600 mg total) by mouth every 6 (six) hours.   IRON PO Take 325 mg by mouth daily.   levothyroxine 25 MCG tablet Commonly known as:  SYNTHROID Take 25 mcg by mouth daily.   NIFEdipine 30 MG 24 hr tablet Commonly known as:  ADALAT CC Take 1 tablet (30 mg total) by mouth daily. Start taking on:  November 18, 2018   PRENATAL VITAMIN PO Take 1 tablet by mouth daily.       Diet: routine diet, mild asymptomatic anemia on PO iron QD  Activity: Advance as tolerated. Pelvic rest for 6 weeks.   Outpatient follow up:1 week for blood pressure check, 6 weeks for postpartum visit Follow up Appt:No future appointments. Follow up Visit:No follow-ups on  file.  Postpartum contraception: None  Newborn Data: Live born female  Birth Weight: 4 lb 13.6 oz (2200 g) APGAR: 9, 9  Newborn Delivery   Birth date/time:  11/14/2018 09:19:00 Delivery type:  Vaginal, Spontaneous     Baby Feeding: Breast Disposition:home with mother   11/17/2018 Brooke Reeves, CNM

## 2018-11-17 NOTE — Lactation Note (Signed)
This note was copied from a baby's chart. Lactation Consultation Note  Patient Name: Brooke Reeves PRXYV'O Date: 11/17/2018    [facility] Follow Up Visit:  Mother sleeping; will return later this morning.                   Olyn Landstrom R [name] 11/17/2018, 8:04 AM

## 2018-11-17 NOTE — Lactation Note (Signed)
This note was copied from a baby's chart. Lactation Consultation Note  Patient Name: Brooke Reeves ESLPN'P Date: 11/17/2018 Reason for consult: Follow-up assessment;Difficult latch;Early term 37-38.6wks;Primapara;1st time breastfeeding;Infant < 6lbs Type of Endocrine Disorder?: Thyroid   P1 mother whose infant is now 59 hours old.  This is an ETI at 38+3 weeks weighing < 6 lbs.  Baby was asleep in bassinet when I arrived.  Family had requested I return at this time to assist with latching.  Mother's breasts are soft and non tender and nipples are everted and intact.  Assisted baby to latch in the football hold on the right breast without difficulty.  However, once at the breast baby would not suck.  Dribbled formula drops on the nipple and she sucked a few good times before becoming irritated and pushing off of the breast.  Repeated this a couple of times with the same result.  Discussed with the parents her age and that she has had many artificial nipples and that it may take a few days before she really learns to breast feed well.  I am encouraged that she does open wide and lips are flanged and in deep when sucking.  Offered to try the cross cradle hold on the same breast and mother accepted.  Baby latched easily and a few sucks noted before she became agitated and would not feed.  Placed her STS with mother and she calmed.  Also explained to the family that baby was probably not ready to feed: she was not showing cues but I assisted at their request and baby may need a little more time before we attempt again.  Parents verbalized understanding and will call as needed for more latch assistance.  Father will give supplement and mother will pump with the DEBP.  She has not been pumping consistently so I stressed the importance of pumping every three hours after breast feeding baby.  She will put baby to breast first prior to any supplement and keep encouraging baby to latch and feed.  Father is very  supportive and a good help for mother.  Parents were happy to learn a few techniques to assist baby in feeding and happy that she took a few sucks at the breast.  RN updated.  Offered to return at the next feeding if needed.     Maternal Data Formula Feeding for Exclusion: No Has patient been taught Hand Expression?: Yes Does the patient have breastfeeding experience prior to this delivery?: No  Feeding Feeding Type: Breast Fed  LATCH Score Latch: Too sleepy or reluctant, no latch achieved, no sucking elicited.  Audible Swallowing: None  Type of Nipple: Everted at rest and after stimulation  Comfort (Breast/Nipple): Soft / non-tender  Hold (Positioning): Assistance needed to correctly position infant at breast and maintain latch.  LATCH Score: 5  Interventions Interventions: Breast feeding basics reviewed;Assisted with latch;Skin to skin;Breast massage;Hand express;Breast compression;Position options;Support pillows;Adjust position;DEBP  Lactation Tools Discussed/Used Tools: Pump Initiated by:: Already initated   Consult Status Consult Status: Follow-up Date: 11/18/18 Follow-up type: In-patient    Trenten Watchman R Kanai Hilger 11/17/2018, 1:33 PM

## 2018-11-17 NOTE — Discharge Instructions (Signed)
Postpartum Care After Vaginal Delivery ° °The period of time right after you deliver your newborn is called the postpartum period. °What kind of medical care will I receive? °· You may continue to receive fluids and medicines through an IV tube inserted into one of your veins. °· If an incision was made near your vagina (episiotomy) or if you had some vaginal tearing during delivery, cold compresses may be placed on your episiotomy or your tear. This helps to reduce pain and swelling. °· You may be given a squirt bottle to use when you go to the bathroom. You may use this until you are comfortable wiping as usual. To use the squirt bottle, follow these steps: °? Before you urinate, fill the squirt bottle with warm water. Do not use hot water. °? After you urinate, while you are sitting on the toilet, use the squirt bottle to rinse the area around your urethra and vaginal opening. This rinses away any urine and blood. °? You may do this instead of wiping. As you start healing, you may use the squirt bottle before wiping yourself. Make sure to wipe gently. °? Fill the squirt bottle with clean water every time you use the bathroom. °· You will be given sanitary pads to wear. °How can I expect to feel? °· You may not feel the need to urinate for several hours after delivery. °· You will have some soreness and pain in your abdomen and vagina. °· If you are breastfeeding, you may have uterine contractions every time you breastfeed for up to several weeks postpartum. Uterine contractions help your uterus return to its normal size. °· It is normal to have vaginal bleeding (lochia) after delivery. The amount and appearance of lochia is often similar to a menstrual period in the first week after delivery. It will gradually decrease over the next few weeks to a dry, yellow-brown discharge. For most women, lochia stops completely by 6-8 weeks after delivery. Vaginal bleeding can vary from woman to woman. °· Within the first few  days after delivery, you may have breast engorgement. This is when your breasts feel heavy, full, and uncomfortable. Your breasts may also throb and feel hard, tightly stretched, warm, and tender. After this occurs, you may have milk leaking from your breasts. Your health care provider can help you relieve discomfort due to breast engorgement. Breast engorgement should go away within a few days. °· You may feel more sad or worried than normal due to hormonal changes after delivery. These feelings should not last more than a few days. If these feelings do not go away after several days, speak with your health care provider. °How should I care for myself? °· Tell your health care provider if you have pain or discomfort. °· Drink enough water to keep your urine clear or pale yellow. °· Wash your hands thoroughly with soap and water for at least 20 seconds after changing your sanitary pads, after using the toilet, and before holding or feeding your baby. °· If you are not breastfeeding, avoid touching your breasts a lot. Doing this can make your breasts produce more milk. °· If you become weak or lightheaded, or you feel like you might faint, ask for help before: °? Getting out of bed. °? Showering. °· Change your sanitary pads frequently. Watch for any changes in your flow, such as a sudden increase in volume, a change in color, the passing of large blood clots. If you pass a blood clot from your vagina,   save it to show to your health care provider. Do not flush blood clots down the toilet without having your health care provider look at them. °· Make sure that all your vaccinations are up to date. This can help protect you and your baby from getting certain diseases. You may need to have immunizations done before you leave the hospital. °· If desired, talk with your health care provider about methods of family planning or birth control (contraception). °How can I start bonding with my baby? °Spending as much time as  possible with your baby is very important. During this time, you and your baby can get to know each other and develop a bond. Having your baby stay with you in your room (rooming in) can give you time to get to know your baby. Rooming in can also help you become comfortable caring for your baby. Breastfeeding can also help you bond with your baby. °How can I plan for returning home with my baby? °· Make sure that you have a car seat installed in your vehicle. °? Your car seat should be checked by a certified car seat installer to make sure that it is installed safely. °? Make sure that your baby fits into the car seat safely. °· Ask your health care provider any questions you have about caring for yourself or your baby. Make sure that you are able to contact your health care provider with any questions after leaving the hospital. °This information is not intended to replace advice given to you by your health care provider. Make sure you discuss any questions you have with your health care provider. °Document Released: 05/15/2007 Document Revised: 12/21/2015 Document Reviewed: 06/22/2015 °[employer] Interactive Patient Education © 2018 [employer] ° ° °Postpartum Depression and Baby Blues °The postpartum period begins right after the birth of a baby. During this time, there is often a great amount of joy and excitement. It is also a time of many changes in the life of the parents. Regardless of how many times a mother gives birth, each child brings new challenges and dynamics to the family. It is not unusual to have feelings of excitement along with confusing shifts in moods, emotions, and thoughts. All mothers are at risk of developing postpartum depression or the "baby blues." These mood changes can occur right after giving birth, or they may occur many months after giving birth. The baby blues or postpartum depression can be mild or severe. Additionally, postpartum depression can go away rather quickly, or it can  be a long-term condition. °What are the causes? °Raised hormone levels and the rapid drop in those levels are thought to be a main cause of postpartum depression and the baby blues. A number of hormones change during and after pregnancy. Estrogen and progesterone usually decrease right after the delivery of your baby. The levels of thyroid hormone and various cortisol steroids also rapidly drop. Other factors that play a role in these mood changes include major life events and genetics. °What increases the risk? °If you have any of the following risks for the baby blues or postpartum depression, know what symptoms to watch out for during the postpartum period. Risk factors that may increase the likelihood of getting the baby blues or postpartum depression include: °· Having a personal or family history of depression. °· Having depression while being pregnant. °· Having premenstrual mood issues or mood issues related to oral contraceptives. °· Having a lot of life stress. °· Having marital conflict. °· Lacking   a social support network. °· Having a baby with special needs. °· Having health problems, such as diabetes. ° °What are the signs or symptoms? °Symptoms of baby blues include: °· Brief changes in mood, such as going from extreme happiness to sadness. °· Decreased concentration. °· Difficulty sleeping. °· Crying spells, tearfulness. °· Irritability. °· Anxiety. ° °Symptoms of postpartum depression typically begin within the first month after giving birth. These symptoms include: °· Difficulty sleeping or excessive sleepiness. °· Marked weight loss. °· Agitation. °· Feelings of worthlessness. °· Lack of interest in activity or food. ° °Postpartum psychosis is a very serious condition and can be dangerous. Fortunately, it is rare. Displaying any of the following symptoms is cause for immediate medical attention. Symptoms of postpartum psychosis include: °· Hallucinations and delusions. °· Bizarre or disorganized  behavior. °· Confusion or disorientation. ° °How is this diagnosed? °A diagnosis is made by an evaluation of your symptoms. There are no medical or lab tests that lead to a diagnosis, but there are various questionnaires that a health care provider may use to identify those with the baby blues, postpartum depression, or psychosis. Often, a screening tool called the Edinburgh Postnatal Depression Scale is used to diagnose depression in the postpartum period. °How is this treated? °The baby blues usually goes away on its own in 1-2 weeks. Social support is often all that is needed. You will be encouraged to get adequate sleep and rest. Occasionally, you may be given medicines to help you sleep. °Postpartum depression requires treatment because it can last several months or longer if it is not treated. Treatment may include individual or group therapy, medicine, or both to address any social, physiological, and psychological factors that may play a role in the depression. Regular exercise, a healthy diet, rest, and social support may also be strongly recommended. °Postpartum psychosis is more serious and needs treatment right away. Hospitalization is often needed. °Follow these instructions at home: °· Get as much rest as you can. Nap when the baby sleeps. °· Exercise regularly. Some women find yoga and walking to be beneficial. °· Eat a balanced and nourishing diet. °· Do little things that you enjoy. Have a cup of tea, take a bubble bath, read your favorite magazine, or listen to your favorite music. °· Avoid alcohol. °· Ask for help with household chores, cooking, grocery shopping, or running errands as needed. Do not try to do everything. °· Talk to people close to you about how you are feeling. Get support from your partner, family members, friends, or other new moms. °· Try to stay positive in how you think. Think about the things you are grateful for. °· Do not spend a lot of time alone. °· Only take  over-the-counter or prescription medicine as directed by your health care provider. °· Keep all your postpartum appointments. °· Let your health care provider know if you have any concerns. °Contact a health care provider if: °You are having a reaction to or problems with your medicine. °Get help right away if: °· You have suicidal feelings. °· You think you may harm the baby or someone else. °This information is not intended to replace advice given to you by your health care provider. Make sure you discuss any questions you have with your health care provider. °Document Released: 04/21/2004 Document Revised: 12/24/2015 Document Reviewed: 04/29/2013 °[employer]vier Interactive Patient Education © 2017 [employer] ° ° °Preeclampsia and Eclampsia ° °Preeclampsia is a serious condition that may develop during pregnancy. It   is also called toxemia of pregnancy. This condition causes high blood pressure along with other symptoms, such as swelling and headaches. These symptoms may develop as the condition gets worse. Preeclampsia may occur at 20 weeks of pregnancy or later. °Diagnosing and treating preeclampsia early is very important. If not treated early, it can cause serious problems for you and your baby. One problem it can lead to is eclampsia. Eclampsia is a condition that causes muscle jerking or shaking (convulsions or seizures) and other serious problems for the mother. During pregnancy, delivering your baby may be the best treatment for preeclampsia or eclampsia. For most women, preeclampsia and eclampsia symptoms go away after giving birth. °In rare cases, a woman may develop preeclampsia after giving birth (postpartum preeclampsia). This usually occurs within 48 hours after childbirth but may occur up to 6 weeks after giving birth. °What are the causes? °The cause of preeclampsia is not known. °What increases the risk? °The following risk factors make you more likely to develop preeclampsia: °· Being pregnant for  the first time. °· Having had preeclampsia during a past pregnancy. °· Having a family history of preeclampsia. °· Having high blood pressure. °· Being pregnant with more than one baby. °· Being 35 or older. °· Being African-American. °· Having kidney disease or diabetes. °· Having medical conditions such as lupus or blood diseases. °· Being very overweight (obese). °What are the signs or symptoms? °The earliest signs of preeclampsia are: °· High blood pressure. °· Increased protein in your urine. Your health care provider will check for this at every visit before you give birth (prenatal visit). °Other symptoms that may develop as the condition gets worse include: °· Severe headaches. °· Sudden weight gain. °· Swelling of the hands, face, legs, and feet. °· Nausea and vomiting. °· Vision problems, such as blurred or double vision. °· Numbness in the face, arms, legs, and feet. °· Urinating less than usual. °· Dizziness. °· Slurred speech. °· Abdominal pain, especially upper abdominal pain. °· Convulsions or seizures. °How is this diagnosed? °There are no screening tests for preeclampsia. Your health care provider will ask you about symptoms and check for signs of preeclampsia during your prenatal visits. You may also have tests that include: °· Urine tests. °· Blood tests. °· Checking your blood pressure. °· Monitoring your baby’s heart rate. °· Ultrasound. °How is this treated? °You and your health care provider will determine the treatment approach that is best for you. Treatment may include: °· Having more frequent prenatal exams to check for signs of preeclampsia, if you have an increased risk for preeclampsia. °· Medicine to lower your blood pressure. °· Staying in the hospital, if your condition is severe. There, treatment will focus on controlling your blood pressure and the amount of fluids in your body (fluid retention). °· Taking medicine (magnesium sulfate) to prevent seizures. This may be given as an  injection or through an IV. °· Taking a low-dose aspirin during your pregnancy. °· Delivering your baby early, if your condition gets worse. You may have your labor started with medicine (induced), or you may have a cesarean delivery. °Follow these instructions at home: °Eating and drinking ° °· Drink enough fluid to keep your urine pale yellow. °· Avoid caffeine. °Lifestyle °· Do not use any products that contain nicotine or tobacco, such as cigarettes and e-cigarettes. If you need help quitting, ask your health care provider. °· Do not use alcohol or drugs. °· Avoid stress as much as possible. Rest   and get plenty of sleep. °General instructions °· Take over-the-counter and prescription medicines only as told by your health care provider. °· When lying down, lie on your left side. This keeps pressure off your major blood vessels. °· When sitting or lying down, raise (elevate) your feet. Try putting some pillows underneath your lower legs. °· Exercise regularly. Ask your health care provider what kinds of exercise are best for you. °· Keep all follow-up and prenatal visits as told by your health care provider. This is important. °How is this prevented? °There is no known way of preventing preeclampsia or eclampsia from developing. However, to lower your risk of complications and detect problems early: °· Get regular prenatal care. Your health care provider may be able to diagnose and treat the condition early. °· Maintain a healthy weight. Ask your health care provider for help managing weight gain during pregnancy. °· Work with your health care provider to manage any long-term (chronic) health conditions you have, such as diabetes or kidney problems. °· You may have tests of your blood pressure and kidney function after giving birth. °· Your health care provider may have you take low-dose aspirin during your next pregnancy. °Contact a health care provider if: °· You have symptoms that your health care provider told  you may require more treatment or monitoring, such as: °? Headaches. °? Nausea or vomiting. °? Abdominal pain. °? Dizziness. °? Light-headedness. °Get help right away if: °· You have severe: °? Abdominal pain. °? Headaches that do not get better. °? Dizziness. °? Vision problems. °? Confusion. °? Nausea or vomiting. °· You have any of the following: °? A seizure. °? Sudden, rapid weight gain. °? Sudden swelling in your hands, ankles, or face. °? Trouble moving any part of your body. °? Numbness in any part of your body. °? Trouble speaking. °? Abnormal bleeding. °· You faint. °Summary °· Preeclampsia is a serious condition that may develop during pregnancy. It is also called toxemia of pregnancy. °· This condition causes high blood pressure along with other symptoms, such as swelling and headaches. °· Diagnosing and treating preeclampsia early is very important. If not treated early, it can cause serious problems for you and your baby. °· Get help right away if you have symptoms that your health care provider told you to watch for. °This information is not intended to replace advice given to you by your health care provider. Make sure you discuss any questions you have with your health care provider. °Document Released: 07/15/2000 Document Revised: 07/04/2017 Document Reviewed: 02/22/2016 °[employer]vier Interactive Patient Education © 2019 [employer] ° °

## 2018-11-17 NOTE — Lactation Note (Signed)
This note was copied from a baby's chart. Lactation Consultation Note  Patient Name: Brooke Reeves [id number] Date: 11/17/2018 Reason for consult: Follow-up assessment;Difficult latch;Early term 37-38.6wks;Primapara;1st time breastfeeding;Infant < 6lbs Type of Endocrine Disorder?: Thyroid  P1 mother whose infant is now 36 hours old.  This is an ETI at 38+3 weeks weighing < 6 lbs.  Baby was sleeping in bassinet when I arrived.  Parents were eating lunch.  Mother stated that baby is "getting better" but still not good with feeding.  Father asked if I could return at the next feeding to assist.  Family will be discharged today.  I explained that parents can stay after their RN discharges them for me to observe a feeding.  I will return at  Approximately 1300 for feeding.  RN updated.   Maternal Data Formula Feeding for Exclusion: No Has patient been taught Hand Expression?: Yes Does the patient have breastfeeding experience prior to this delivery?: No  Feeding Feeding Type: Breast Milk  LATCH Score                   Interventions    Lactation Tools Discussed/Used Tools: Pump Initiated by:: Already initiated   Consult Status Consult Status: Complete Date: 11/17/18 Follow-up type: Call as needed    Jaysa Kise R Gaylord Seydel 11/17/2018, 12:27 PM

## 2018-11-19 ENCOUNTER — Inpatient Hospital Stay (HOSPITAL_COMMUNITY): Payer: 59

## 2018-12-27 ENCOUNTER — Other Ambulatory Visit (HOSPITAL_COMMUNITY)
Admission: RE | Admit: 2018-12-27 | Discharge: 2018-12-27 | Disposition: A | Discharge status: Home / Self Care | Payer: 59 | Source: Ambulatory Visit | Attending: Obstetrics and Gynecology | Admitting: Obstetrics and Gynecology

## 2018-12-27 ENCOUNTER — Other Ambulatory Visit: Payer: Self-pay | Admitting: Obstetrics and Gynecology

## 2018-12-27 DIAGNOSIS — Z124 Encounter for screening for malignant neoplasm of cervix: Secondary | ICD-10-CM | POA: Insufficient documentation

## 2018-12-31 LAB — CYTOLOGY - PAP
Diagnosis: NEGATIVE
HPV: NOT DETECTED

## 2020-01-07 IMAGING — US US MFM UA CORD DOPPLER
1 series · 15 of 28 positions shown · non-contrast
Comparison: none

[Series 1: us mfm ua cord doppler · 15 of 87 slices shown]
[im 1/87]
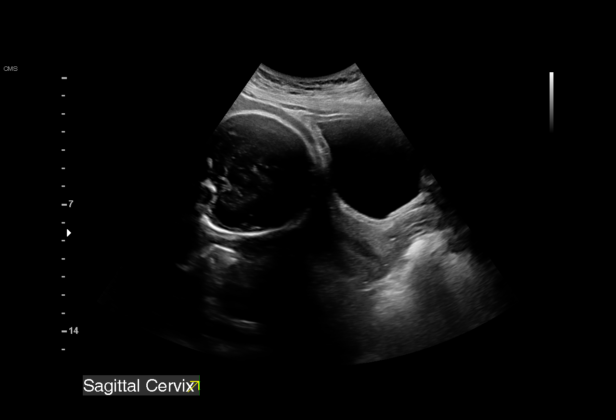
[im 7/87]
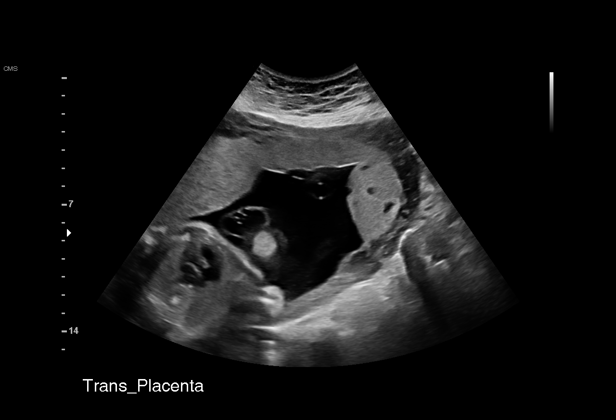
[im 13/87]
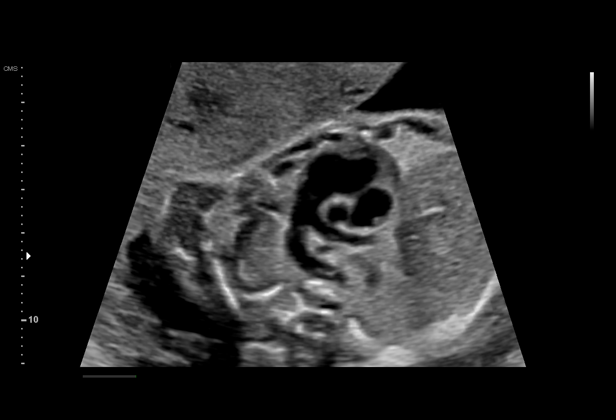
[im 20/87]
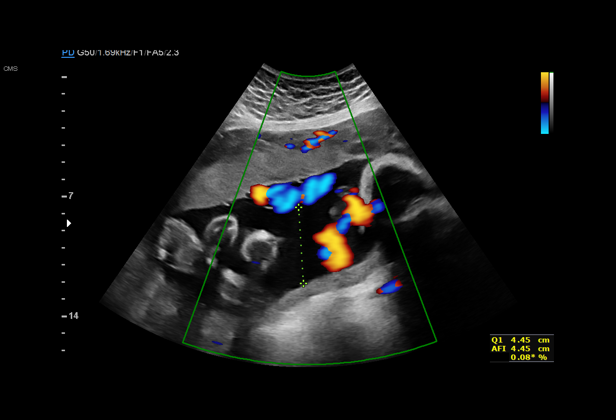
[im 26/87]
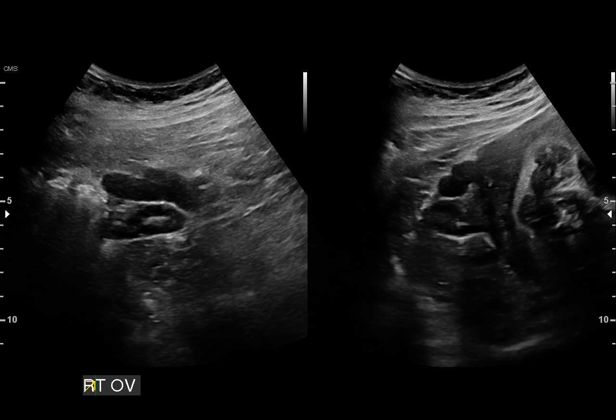
[im 32/87]
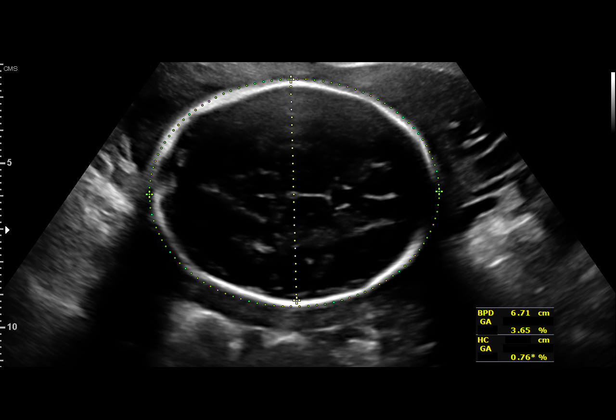
[im 39/87]
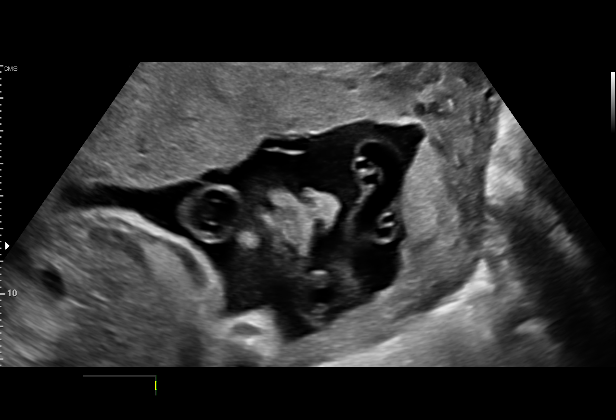
[im 45/87]
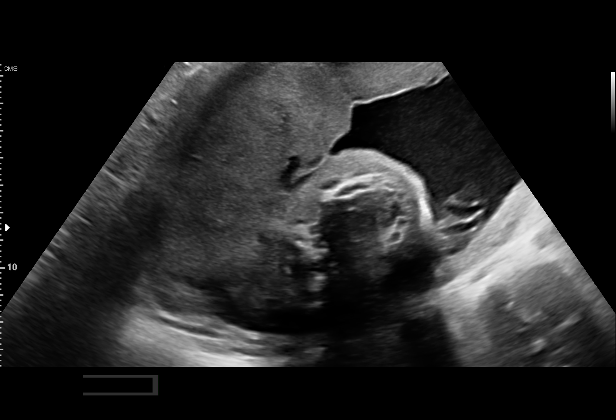
[im 48/87]
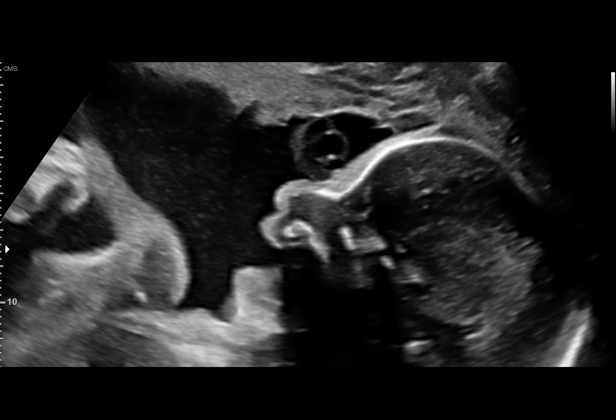
[im 55/87]
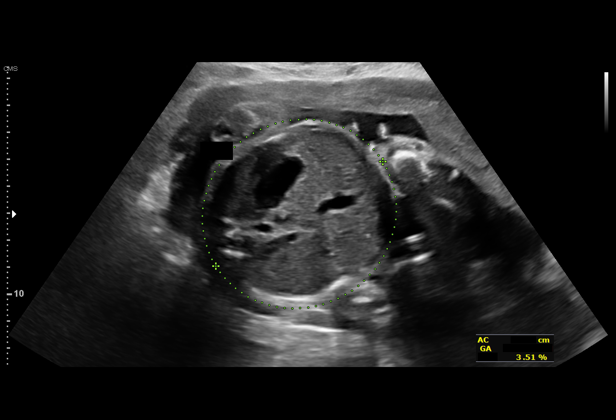
[im 61/87]
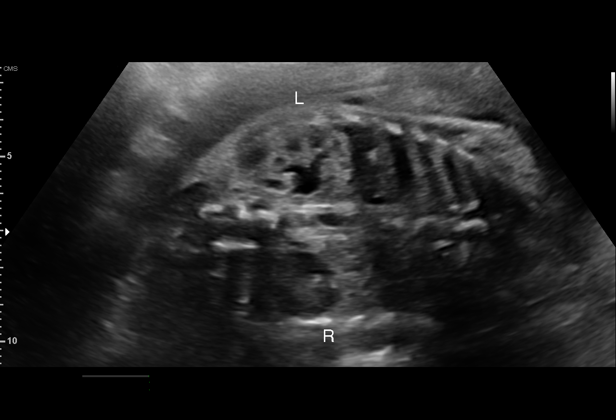
[im 67/87]
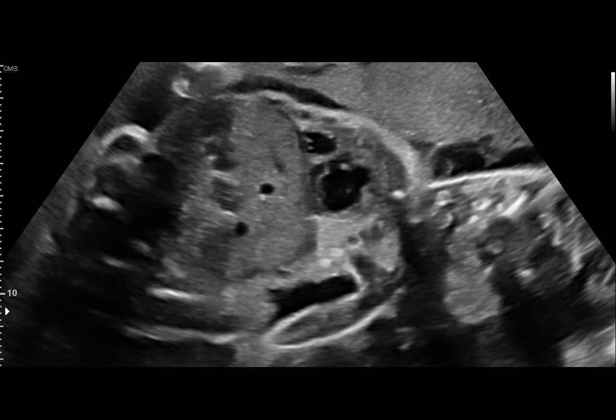
[im 74/87]
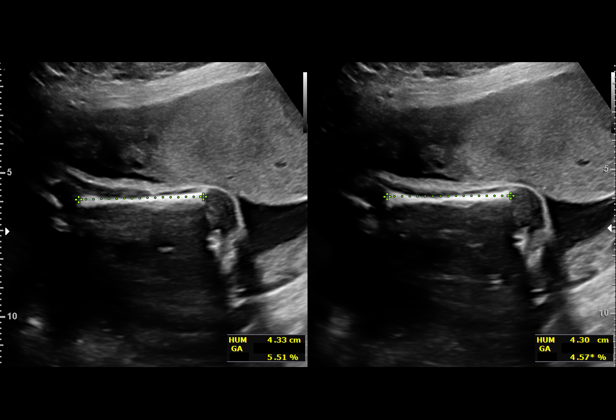
[im 80/87]
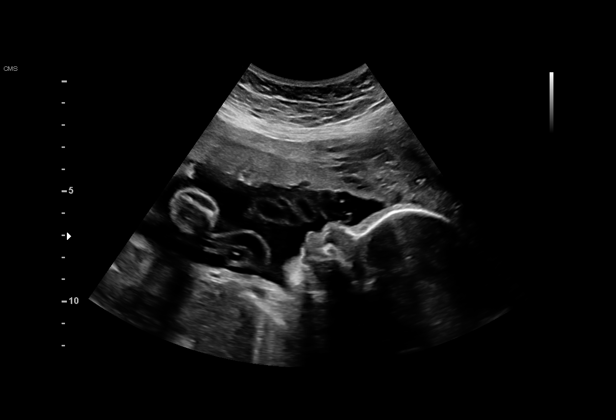
[im 87/87]
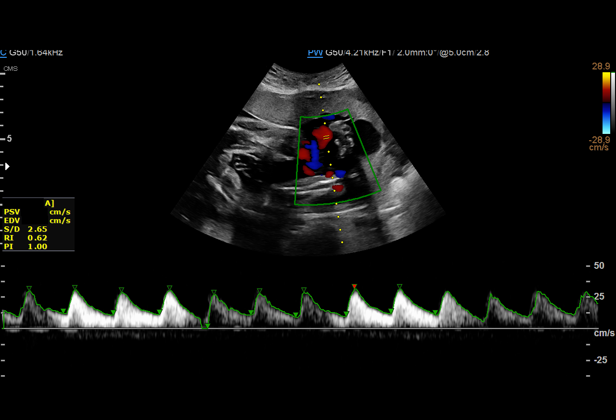

[15 of 28 positions shown; findings below may reference images not displayed]

Ave.,[HOSPITAL]

  1  US MFM OB DETAIL +14 WK              76811.01     MISIAH GM
  2  US MFM UA CORD DOPPLER               76820.02     MISIAH GM
 ----------------------------------------------------------------------

 ----------------------------------------------------------------------
Indications

  Encounter for antenatal screening for
  malformations
  Small for gestational age fetus affecting
  management of mother
  Hypertension - Chronic/Pre-existing (ASA)
  Hypothyroid (Synthroid)
  28 weeks gestation of pregnancy
 ----------------------------------------------------------------------
Fetal Evaluation

 Num Of Fetuses:         1
 Fetal Heart Rate(bpm):  154
 Cardiac Activity:       Observed
 Presentation:           Cephalic
 Placenta:               Anterior
 P. Cord Insertion:      Visualized

 Amniotic Fluid
 AFI FV:      Within normal limits

 AFI Sum(cm)     %Tile       Largest Pocket(cm)
 14.75           51

 RUQ(cm)       RLQ(cm)       LUQ(cm)        LLQ(cm)

Biophysical Evaluation

 Amniotic F.V:   Within normal limits       F. Tone:        Observed
 F. Movement:    Observed                   Score:          [DATE]
 F. Breathing:   Observed
Biometry

 BPD:      67.1  mm     G. Age:  27w 0d          5  %    CI:        72.63   %    70 - 86
                                                         FL/HC:      19.0   %    19.6 -
 HC:      250.4  mm     G. Age:  27w 1d        < 3  %    HC/AC:      1.13        0.99 -
 AC:      221.2  mm     G. Age:  26w 4d          4  %    FL/BPD:     70.8   %    71 - 87
 FL:       47.5  mm     G. Age:  25w 6d        < 3  %    FL/AC:      21.5   %    20 - 24
 HUM:        43  mm     G. Age:  25w 5d        < 5  %
 CER:      33.9  mm     G. Age:  29w 3d         65  %

 Est. FW:     932  gm      2 lb 1 oz     17  %
OB History

 Gravidity:    1         Term:   0        Prem:   0        SAB:   0
 TOP:          0       Ectopic:  0        Living: 0
Gestational Age

 LMP:           28w 4d        Date:  02/18/18                 EDD:   11/25/18
 U/S Today:     26w 5d                                        EDD:   12/08/18
 Best:          28w 4d     Det. By:  LMP  (02/18/18)          EDD:   11/25/18
Anatomy

 Cranium:               Appears normal         LVOT:                   Appears normal
 Cavum:                 Appears normal         Aortic Arch:            Appears normal
 Ventricles:            Appears normal         Ductal Arch:            Appears normal
 Choroid Plexus:        Appears normal         Diaphragm:              Appears normal
 Cerebellum:            Appears normal         Stomach:                Appears normal, left
                                                                       sided
 Posterior Fossa:       Appears normal         Abdomen:                Appears normal
 Nuchal Fold:           Not applicable (>20    Abdominal Wall:         Appears nml (cord
                        wks GA)                                        insert, abd wall)
 Face:                  Appears normal         Cord Vessels:           Appears normal (3
                        (orbits and profile)                           vessel cord)
 Lips:                  Appears normal         Kidneys:                Appear normal
 Palate:                Appears normal         Bladder:                Appears normal
 Thoracic:              Appears normal         Spine:                  Not well visualized
 Heart:                 Appears normal         Upper Extremities:      Appears normal
                        (4CH, axis, and
                        situs)
 RVOT:                  Appears normal         Lower Extremities:      Appears normal

 Other:  Heels visualized. 3VV and 3VTV visualized.
Doppler - Fetal Vessels

 Umbilical Artery
  S/D     %tile     RI              PI                     ADFV    RDFV
 4.33       94     0.8            1.49                        No      No

Cervix Uterus Adnexa

 Cervix
 Normal appearance by transabdominal scan.
 Uterus
 No abnormality visualized.

 Left Ovary
 Within normal limits.

 Right Ovary
 Within normal limits.

 Cul De Sac
 No free fluid seen.

 Adnexa
 No abnormality visualized.
Impression

 On ultrasound, amniotic fluid is normal and good fetal activity
 is seen. The estimated fetal weight is at the 17th percentile.
 Head circumference measurement is at between -1 SD and
 mean. Abdominal circumference measurement is at the 4th
 percentile. Incidentally observed antenatal testing is
 reassuring (BPP [DATE]). Umbilical artery Doppler showed
 normal diastolic flow.
 Fetal anatomy appears normal but limited by advanced
 gestational age.
 xxxxxxxxxxxxxxxxxxxxxxxxxxxxxxxxx
 Consultation Note (Copied from [REDACTED])

 Maternal-Fetal Medicine
 Name: Bente Yde Lund-Jacobsen
 MRN: [id number]
 Requesting Provider: Murni, Ming-Chu

 [facility] had the pleasure of [facility] She was accompanied by her
 suspected fetal growth restriction.
 On your office ultrasound performed in July 2018, the
 estimated fetal weight was at the 39th percentile. Subsequent
 ultrasound performed on 08/30/2018 showed the estimated
 fetal weight at the 19th percentile.
 Her prenatal course has been uneventful so far. On serum
 screening (quad), the risks of fetal aneuploidies and open-
 neural tube defects are not increased.
 Past medical history is significant for chronic hypertension.
 Patient was taking amlodipine before pregnancy and she
 discontinued at the start of pregnancy on the advice of her
 PCP. Her blood pressures have been during prenatal visits.
 She takes low-dose aspirin prophylaxis.
 She does not have diabetes. She has a diagnosis of
 hypothyroidism and takes levothyroxine 25 micrograms daily.
 PSH: Lumbosacral (L4-S1) discectomy (9512 in [place]),
 tonsillectomy (childhood).
 Medications: Prenatal vitamins, levothyroxine, low-dose
 aspirin.
 Allergies: NKDA.
 Social: Denies tobacco or drug or alcohol use. She has been
 married 4+ years and her husband is in good health. Patient
 recently moved [place] the [place] ([place]
 Family: No history of venous thromboembolism in the family.
 I counseled the patient on the following:
 Suspected fetal growth restriction:
 I explained the findings and reassured the couple. I
 discussed the definition of IUGR that is based on the
 estimated fetal weigh being at less than the 10th percentile. It
 is difficult to differentiate between a constitutionally-small
 fetus and the one with growth restriction. In addition, there
 are ethnic variations.

 Patient has one risk factor of chronic hypertension that is well-
 controlled without antihypertensives. This is her first
 pregnancy and both primigravida and chronic hypertension
 increase the likelihood of preeclampsia. Fetal growth
 restriction may precede development of preeclampsia. I
 discussed the benefit of low-dose aspirin that delays or
 prevents preeclampsia.
 She does not have any other risk factors.
 I recommend a follow-up fetal growth assessment in 3 weeks.
 If EFW falls below the 10th percentile, we would recommend
 weekly antenatal testing till delivery. I explained antenatal
 testing (BPP, NST and Doppler) and their significance and
 limitations.
 Timing of delivery: ACOG recommends delivery between 38w
 0d till 39w 6d gestation in isolated fetal growth restriction. If
 fetal growth restriction is diagnosed on subsequent visits, we
 would recommend delivery at 39 weeks provided antenatal
 testing is reassuring.
 Her history of discectomy is not a contraindication to vaginal
 delivery.
Recommendations

 -An appointment was made for her to return in 3 weeks for
 fetal growth assessment.
 -Continue low-dose aspirin.
 -BP checks at least weekly.
 -Continue levothyroxine.
 -TSH levels if not checked in the last month.
                 Arm[place], [place]
